# Patient Record
Sex: Male | Born: 1980 | Race: Black or African American | Hispanic: No | Smoking: Current every day smoker
Health system: Southern US, Community
[De-identification: ages and names within clinical notes are randomized; demographics above are authoritative.]

## PROBLEM LIST (undated history)

## (undated) DIAGNOSIS — R51 Headache: Secondary | ICD-10-CM

## (undated) DIAGNOSIS — J45909 Unspecified asthma, uncomplicated: Secondary | ICD-10-CM

## (undated) HISTORY — DX: Unspecified asthma, uncomplicated: J45.909

## (undated) HISTORY — DX: Headache: R51

---

## 2010-02-19 DIAGNOSIS — G8929 Other chronic pain: Secondary | ICD-10-CM

## 2010-02-19 HISTORY — DX: Other chronic pain: G89.29

## 2013-02-19 HISTORY — PX: CHEST SURGERY: SHX595

## 2013-09-05 ENCOUNTER — Emergency Department (HOSPITAL_COMMUNITY): Payer: No Typology Code available for payment source

## 2013-09-05 ENCOUNTER — Emergency Department (HOSPITAL_COMMUNITY)
Admission: EM | Admit: 2013-09-05 | Discharge: 2013-09-05 | Disposition: A | Discharge status: Home / Self Care | Payer: No Typology Code available for payment source | Attending: Emergency Medicine | Admitting: Emergency Medicine

## 2013-09-05 ENCOUNTER — Encounter (HOSPITAL_COMMUNITY): Payer: Self-pay

## 2013-09-05 ENCOUNTER — Telehealth (HOSPITAL_COMMUNITY): Payer: Self-pay

## 2013-09-05 DIAGNOSIS — S0100XA Unspecified open wound of scalp, initial encounter: Secondary | ICD-10-CM | POA: Insufficient documentation

## 2013-09-05 DIAGNOSIS — R4182 Altered mental status, unspecified: Secondary | ICD-10-CM | POA: Insufficient documentation

## 2013-09-05 DIAGNOSIS — S0101XA Laceration without foreign body of scalp, initial encounter: Secondary | ICD-10-CM

## 2013-09-05 DIAGNOSIS — S21109A Unspecified open wound of unspecified front wall of thorax without penetration into thoracic cavity, initial encounter: Secondary | ICD-10-CM | POA: Insufficient documentation

## 2013-09-05 DIAGNOSIS — Y92410 Unspecified street and highway as the place of occurrence of the external cause: Secondary | ICD-10-CM | POA: Insufficient documentation

## 2013-09-05 DIAGNOSIS — F17200 Nicotine dependence, unspecified, uncomplicated: Secondary | ICD-10-CM | POA: Insufficient documentation

## 2013-09-05 DIAGNOSIS — S21119A Laceration without foreign body of unspecified front wall of thorax without penetration into thoracic cavity, initial encounter: Secondary | ICD-10-CM

## 2013-09-05 DIAGNOSIS — Y9389 Activity, other specified: Secondary | ICD-10-CM | POA: Insufficient documentation

## 2013-09-05 LAB — I-STAT CHEM 8, ED
BUN: 7 mg/dL (ref 6–23)
Calcium, Ion: 1.11 mmol/L — ABNORMAL LOW (ref 1.12–1.23)
Chloride: 106 mEq/L (ref 96–112)
Creatinine, Ser: 1.4 mg/dL — ABNORMAL HIGH (ref 0.50–1.35)
GLUCOSE: 141 mg/dL — AB (ref 70–99)
HCT: 55 % — ABNORMAL HIGH (ref 39.0–52.0)
HEMOGLOBIN: 18.7 g/dL — AB (ref 13.0–17.0)
POTASSIUM: 3.1 meq/L — AB (ref 3.7–5.3)
SODIUM: 143 meq/L (ref 137–147)
TCO2: 21 mmol/L (ref 0–100)

## 2013-09-05 LAB — COMPREHENSIVE METABOLIC PANEL
ALT: 31 U/L (ref 0–53)
ANION GAP: 19 — AB (ref 5–15)
AST: 42 U/L — AB (ref 0–37)
Albumin: 4 g/dL (ref 3.5–5.2)
Alkaline Phosphatase: 79 U/L (ref 39–117)
BUN: 8 mg/dL (ref 6–23)
CALCIUM: 8.7 mg/dL (ref 8.4–10.5)
CO2: 21 mEq/L (ref 19–32)
Chloride: 100 mEq/L (ref 96–112)
Creatinine, Ser: 0.99 mg/dL (ref 0.50–1.35)
GFR calc Af Amer: >90 mL/min (ref >90)
GFR calc non Af Amer: >90 mL/min (ref >90)
Glucose, Bld: 136 mg/dL — ABNORMAL HIGH (ref 70–99)
Potassium: 3.3 mEq/L — ABNORMAL LOW (ref 3.7–5.3)
Sodium: 140 mEq/L (ref 137–147)
TOTAL PROTEIN: 8.5 g/dL — AB (ref 6.0–8.3)
Total Bilirubin: 0.3 mg/dL (ref 0.3–1.2)

## 2013-09-05 LAB — SAMPLE TO BLOOD BANK

## 2013-09-05 LAB — CBC
HCT: 47.6 % (ref 39.0–52.0)
Hemoglobin: 16.9 g/dL (ref 13.0–17.0)
MCH: 30.2 pg (ref 26.0–34.0)
MCHC: 35.5 g/dL (ref 30.0–36.0)
MCV: 85 fL (ref 78.0–100.0)
Platelets: 254 10*3/uL (ref 150–400)
RBC: 5.6 MIL/uL (ref 4.22–5.81)
RDW: 13.5 % (ref 11.5–15.5)
WBC: 6.8 10*3/uL (ref 4.0–10.5)

## 2013-09-05 LAB — PROTIME-INR
INR: 1 (ref 0.00–1.49)
PROTHROMBIN TIME: 13.2 s (ref 11.6–15.2)

## 2013-09-05 LAB — ETHANOL: Alcohol, Ethyl (B): 263 mg/dL — ABNORMAL HIGH (ref 0–11)

## 2013-09-05 LAB — CDS SEROLOGY

## 2013-09-05 MED ORDER — IOHEXOL 300 MG/ML  SOLN: 100.0000 mL | Freq: Once | INTRAMUSCULAR | Status: DC | PRN

## 2013-09-05 MED ORDER — IOHEXOL 300 MG/ML  SOLN
100.0000 mL | Freq: Once | INTRAMUSCULAR | Status: AC | PRN
  Administered 2013-09-05: 100 mL via INTRAVENOUS
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
## 2013-09-05 NOTE — ED Notes (Signed)
Continues to refuse care or agree to plan and CT scans. Attempting top rationalize and intellectualize unsuccessfully. Circular arguments refusing care. "feel fine", "want to go home". "will agree if we give him his belongings". Pt has all belongings. Pt looking for his pants. Shown pt that he is still wearing his pants. Looking for belongings. Belongings in his pockets addressed one by one with pt. Wanting cell phone. Did not arrive with cell phone explained. Dr. Loretha StaplerWofford notified. Pt lying supine on stretcher, calm, NAD, c-collar remains in place.

## 2013-09-05 NOTE — ED Notes (Signed)
Wet to dry dressing placed in L axillary wound, secured with fabric tape. 2" arc shaped lac to L postero/parietal head, bleeding controlled.

## 2013-09-05 NOTE — ED Notes (Signed)
Pt pulled cuff off for 1 BP and flexed for another BP. Pt agreeable to EMT for CT: pt to CT.

## 2013-09-05 NOTE — ED Notes (Signed)
Pt uncooperative with VS.

## 2013-09-05 NOTE — ED Notes (Addendum)
[facility] head lac at this time, pt cooperative, resting, NAD, calm, cooperative.

## 2013-09-05 NOTE — ED Notes (Signed)
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

## 2013-09-05 NOTE — ED Notes (Signed)
Pt to CT, no changes, alert, NAD, calm, and remains reluctant to give information & semi-cooperative.

## 2013-09-05 NOTE — ED Notes (Signed)
Pt assisted with ride, given sandwich and escorted to waiting room.

## 2013-09-05 NOTE — ED Notes (Signed)
Patient uncooperative for imaging- talking and moving during exams- best obtainable imaging performed at this time.  After contrast material was injected, patient began to yell again that he did not consent to the tests.  However, contrast was already administered and only saline was being administered at that time. Patient informed that I could not retract the contrast, that it was in his blood stream. He became belligerent, muttering "I did not consent to that." He had however, held up his arm, allowed me to connect the IV tubing, flush the site, and administer the contrast, participating in his care,  before he began to argue that he did not give his consent.  Rapidly changes his mind.
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
## 2013-09-05 NOTE — ED Notes (Signed)
Pt. Returned requesting to be out of work until the stitches are out.  Request denied.
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
## 2013-09-05 NOTE — ED Notes (Signed)
Pt sleeping resting. Dressing CDI. No active bleeding. No changes, VSS. Erroneous variable inconsistent labile rhythms on monitor. EKG done on portable EKG machine done to confirm RRR, NSR (see EKG). Dr. Loretha StaplerWofford aware. Pending disposition and wound repair.

## 2013-09-05 NOTE — ED Notes (Signed)
Xray finished at [facility] 
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
## 2013-09-05 NOTE — ED Notes (Signed)
[facility] AutolivBrenda RN,  [facility]. Loretha StaplerWofford finished suturing, NAD, calm, interactive, polite, cooperative. Wound approximated well (head and L axillary chest), no oozing or drainage. c-collar removed.

## 2013-09-05 NOTE — ED Notes (Signed)
Back to room. [employer] in to speak with pt. Pt trying to take c-collar off.

## 2013-09-05 NOTE — ED Notes (Signed)
[facility]. c-collar remains in place.

## 2013-09-05 NOTE — ED Notes (Signed)
Pt refusing scans, states, "I do not consent to any more CT scans", pt moved during other previous scans trying to reach for cell phone to make phone call.

## 2013-09-05 NOTE — ED Notes (Signed)
Back from CT, scans completed successfully, no changes, pt calm, NAD, flat supine.
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
## 2013-09-05 NOTE — ED Notes (Signed)
c-collar applied  

## 2015-03-03 ENCOUNTER — Encounter: Payer: Self-pay | Admitting: Pediatrics

## 2015-03-03 ENCOUNTER — Ambulatory Visit (INDEPENDENT_AMBULATORY_CARE_PROVIDER_SITE_OTHER): Payer: BLUE CROSS/BLUE SHIELD | Admitting: Pediatrics

## 2015-03-03 VITALS — BP 122/78 | HR 64 | Temp 98.0°F | Resp 16 | Ht 66.54 in | Wt 156.1 lb

## 2015-03-03 DIAGNOSIS — J3089 Other allergic rhinitis: Secondary | ICD-10-CM

## 2015-03-03 DIAGNOSIS — M26649 Arthritis of unspecified temporomandibular joint: Secondary | ICD-10-CM

## 2015-03-03 DIAGNOSIS — Z720 Tobacco use: Secondary | ICD-10-CM | POA: Insufficient documentation

## 2015-03-03 DIAGNOSIS — J4520 Mild intermittent asthma, uncomplicated: Secondary | ICD-10-CM

## 2015-03-03 DIAGNOSIS — M26621 Arthralgia of right temporomandibular joint: Secondary | ICD-10-CM | POA: Insufficient documentation

## 2015-03-03 DIAGNOSIS — M2669 Other specified disorders of temporomandibular joint: Secondary | ICD-10-CM | POA: Diagnosis not present

## 2015-03-03 MED ORDER — FLUTICASONE PROPIONATE 50 MCG/ACT NA SUSP: 2.0000 | Freq: Every day | NASAL | Status: AC

## 2015-03-03 MED ORDER — ALBUTEROL SULFATE HFA 108 (90 BASE) MCG/ACT IN AERS
2.0000 | INHALATION_SPRAY | RESPIRATORY_TRACT | Status: AC | PRN
Start: 2015-03-03 — End: ?

## 2015-03-03 NOTE — Progress Notes (Signed)
[address] [place] [address] Dept: [phone]  New Patient Note  Patient ID: Terry Bender, male    DOB: [birth year]  Age: 35 y.o. 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 He has been working at [employer] for 7 years and when he was asked to mix the bread ingredients he developed coughing and wheezing there. He did use a respirator and it did not make any difference. In his breathing. He has had an itchy rash around his elbows for 8 or 9 years 5 years ago he was exposed to black mold at home. The black mold problem has been remediated. He has had a history of daily headaches for about 4 years and has had a normal MRI of his head and and normal  CT of his head. He recalled his right jaw locking once in the past 4 years  Review of Systems  Constitutional:       Occasionally he has dizziness and does not feel very alert.   HENT:       Nasal congestion at times. At times he has pain in the right jaw. He used to have vertigo. He has ringing in the ears at times  Eyes: Negative.   Respiratory:       Asthma since age 57. The asthma improved dramatically until he started working mixing bread ingredients. Despite having a respirator he had some coughing and wheezing  Cardiovascular: Negative.   Gastrointestinal: Negative.   Genitourinary: Negative.   Musculoskeletal: Negative.   Skin:       Itchy eye rash around his elbows off and on for 8 or 9 years  Neurological:       Daily headaches for about 4 years. These headaches began about a year after he moved away from a house that  had black mold. The black mold has  been remediated. He had a normal MRI of his head 12/07/2013 and also a CT scan of his head on 10/21/2012  Endo/Heme/Allergies: Negative.   Psychiatric/Behavioral: Negative.     Outpatient Encounter Prescriptions as of 03/03/2015  Medication Sig  . tiZANidine (ZANAFLEX) 4 MG tablet At bedtime  . [DISCONTINUED] albuterol (PROAIR HFA) 108 (90 Base) MCG/ACT inhaler Inhale 2 puffs into the lungs.  Marland Kitchen albuterol (PROAIR HFA) 108 (90 Base) MCG/ACT inhaler Inhale 2 puffs into the lungs every 4 (four) hours as needed for wheezing or shortness of breath.  . fluticasone (FLONASE) 50 MCG/ACT nasal spray Place 2 sprays into both nostrils daily.   No facility-administered encounter medications on file as of 03/03/2015.     Drug Allergies:  No Known Allergies  Family History: Akeel's family history is negative for Allergic rhinitis, Angioedema, Asthma, Eczema, Immunodeficiency, Urticaria, and Lupus..  Social and environmental-he smokes cigarettes frequently. There are no animals in the house. When he mixed bread ingredients at work he developed some coughing and wheezing despite the use of a respirator. He is better away from mixing the bread ingredients. He has been working 7 years at this location  Physical Exam: BP 122/78 mmHg  Pulse 64  Temp(Src) 98 F (36.7 C) (Oral)  Resp 16  Ht 5' 6.53" (  1.69 m)  Wt 156 lb 1.4 oz (70.8 kg)  BMI 24.79 kg/m2   Physical Exam  Constitutional: He is oriented to person, place, and time. He appears well-developed and well-nourished.  HENT:  Eyes normal. Ears normal. Nose mild swelling of nasal turbinates. Pharynx normal except for a tender right TM joint  Neck: Neck supple. No thyromegaly present.  Cardiovascular:  S1 and S2 normal no murmurs  Pulmonary/Chest:  Clear to percussion and auscultation  Abdominal: Soft. There is no tenderness (no hepatosplenomegaly).  Musculoskeletal:  Tender right TM joint  Lymphadenopathy:    He has no cervical adenopathy.    Neurological: He is alert and oriented to person, place, and time.  Skin:  Clear  Psychiatric: He has a normal mood and affect. His behavior is normal. Judgment and thought content normal.  Vitals reviewed.   Diagnostics: Allergy skin tests were extremely positive to grass pollens, weeds, tree pollens, molds, dust mite, cockroach. Slight reaction noted to cat  FVC 4.12 L FEV1 3.59 L. Predicted FVC 4.04 L predicted FEV1 3.37 L. After albuterol 2 puffs FVC 4.14 L FEV1 3.66 L-spirometry in the normal range and no significant improvement after albuterol   Assessment Assessment and Plan: 1. Mild intermittent asthma, uncomplicated   2. Other allergic rhinitis   3. TMJ arthritis   4. Tobacco use     Meds ordered this encounter  Medications  . fluticasone (FLONASE) 50 MCG/ACT nasal spray    Sig: Place 2 sprays into both nostrils daily.    Dispense:  16 g    Refill:  5    For stuffy nose or drainage  . albuterol (PROAIR HFA) 108 (90 Base) MCG/ACT inhaler    Sig: Inhale 2 puffs into the lungs every 4 (four) hours as needed for wheezing or shortness of breath.    Dispense:  1 Inhaler    Refill:  1    Patient Instructions  Cetirizine 10 mg once a day Fluticasone 2 sprays per nostril once a day for stuffy nose Pro-air 2 puffs every 4 hours if needed for wheezing or coughing spells Prednisone 20 mg twice a day for 3 days, 20 mg on the fourth day, 10 mg on the fifth day. 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 You may use Aleve twice a day for the pain in the jaw  He should quit smoking cigarettes    Return in about 6 weeks (around 04/14/2015).   Thank you for the opportunity to care for this patient.  Please do not hesitate to contact me with questions.  Tonette BihariJ. A. Tyeisha Dinan,  M.D.  [facility] [address] [place], [place] [address] [phone]

## 2015-03-03 NOTE — Patient Instructions (Addendum)
Cetirizine 10 mg once a day Fluticasone 2 sprays per nostril once a day for stuffy nose Pro-air 2 puffs every 4 hours if needed for wheezing or coughing spells Prednisone 20 mg twice a day for 3 days, 20 mg on the fourth day, 10 mg on the fifth day. 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 You may use Aleve twice a day for the pain in the jaw  He should quit smoking cigarettes

## 2015-04-14 ENCOUNTER — Encounter: Payer: Self-pay | Admitting: Pediatrics

## 2015-04-14 ENCOUNTER — Ambulatory Visit (INDEPENDENT_AMBULATORY_CARE_PROVIDER_SITE_OTHER): Payer: BLUE CROSS/BLUE SHIELD | Admitting: Pediatrics

## 2015-04-14 VITALS — BP 114/66 | HR 80 | Temp 99.1°F | Resp 24

## 2015-04-14 DIAGNOSIS — J301 Allergic rhinitis due to pollen: Secondary | ICD-10-CM

## 2015-04-14 DIAGNOSIS — H6091 Unspecified otitis externa, right ear: Secondary | ICD-10-CM | POA: Diagnosis not present

## 2015-04-14 DIAGNOSIS — J4520 Mild intermittent asthma, uncomplicated: Secondary | ICD-10-CM

## 2015-04-14 DIAGNOSIS — Z720 Tobacco use: Secondary | ICD-10-CM | POA: Diagnosis not present

## 2015-04-14 DIAGNOSIS — H6090 Unspecified otitis externa, unspecified ear: Secondary | ICD-10-CM | POA: Insufficient documentation

## 2015-04-14 LAB — PULMONARY FUNCTION TEST

## 2015-04-14 MED ORDER — NEOMYCIN-POLYMYXIN-HC 1 % OT SOLN: 3.0000 [drp] | Freq: Four times a day (QID) | OTIC | Status: AC

## 2015-04-14 NOTE — Patient Instructions (Addendum)
Cortisporin ear drops-4 drops 3 times a day in the right ear for 1 week See a dentist regarding TMJ Call me if you are not doing well on this treatment plan Continue on your other medications Stop smoking cigarettes
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
## 2015-06-28 ENCOUNTER — Telehealth: Payer: Self-pay | Admitting: *Deleted

## 2015-06-28 NOTE — Telephone Encounter (Signed)
Made in error
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
## 2015-10-12 ENCOUNTER — Ambulatory Visit: Payer: BLUE CROSS/BLUE SHIELD | Admitting: Allergy and Immunology

## 2015-10-13 ENCOUNTER — Ambulatory Visit: Payer: BLUE CROSS/BLUE SHIELD | Admitting: Allergy and Immunology
